# Patient Record
Sex: Male | Born: 1980 | Race: Black or African American | Hispanic: No | Marital: Single | Smoking: Current some day smoker
Health system: Southern US, Community
[De-identification: ages and names within clinical notes are randomized; demographics above are authoritative.]

## PROBLEM LIST (undated history)

## (undated) DIAGNOSIS — J40 Bronchitis, not specified as acute or chronic: Secondary | ICD-10-CM

## (undated) HISTORY — PX: FINGER SURGERY: SHX640

---
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
## 2003-04-15 ENCOUNTER — Emergency Department (HOSPITAL_COMMUNITY): Admission: EM | Admit: 2003-04-15 | Discharge: 2003-04-15 | Payer: Self-pay | Admitting: Emergency Medicine

## 2003-07-11 ENCOUNTER — Emergency Department (HOSPITAL_COMMUNITY): Admission: AD | Admit: 2003-07-11 | Discharge: 2003-07-11 | Payer: Self-pay | Admitting: Emergency Medicine

## 2004-01-13 ENCOUNTER — Emergency Department (HOSPITAL_COMMUNITY): Admission: EM | Admit: 2004-01-13 | Discharge: 2004-01-13 | Payer: Self-pay | Admitting: Emergency Medicine

## 2004-04-09 ENCOUNTER — Emergency Department (HOSPITAL_COMMUNITY): Admission: EM | Admit: 2004-04-09 | Discharge: 2004-04-09 | Payer: Self-pay | Admitting: Emergency Medicine

## 2004-07-02 ENCOUNTER — Emergency Department (HOSPITAL_COMMUNITY): Admission: EM | Admit: 2004-07-02 | Discharge: 2004-07-02 | Payer: Self-pay | Admitting: Emergency Medicine

## 2006-07-29 ENCOUNTER — Emergency Department: Payer: Self-pay | Admitting: Emergency Medicine

## 2006-12-04 ENCOUNTER — Emergency Department: Payer: Self-pay | Admitting: Emergency Medicine

## 2006-12-17 ENCOUNTER — Emergency Department (HOSPITAL_COMMUNITY): Admission: EM | Admit: 2006-12-17 | Discharge: 2006-12-17 | Payer: Self-pay | Admitting: Emergency Medicine

## 2011-06-07 ENCOUNTER — Emergency Department: Payer: Self-pay | Admitting: Emergency Medicine

## 2017-12-21 ENCOUNTER — Emergency Department
Admission: EM | Admit: 2017-12-21 | Discharge: 2017-12-21 | Disposition: A | Discharge status: Home / Self Care | Payer: Self-pay | Attending: Emergency Medicine | Admitting: Emergency Medicine

## 2017-12-21 ENCOUNTER — Other Ambulatory Visit: Payer: Self-pay

## 2017-12-21 ENCOUNTER — Emergency Department: Payer: Self-pay

## 2017-12-21 ENCOUNTER — Encounter: Payer: Self-pay | Admitting: Emergency Medicine

## 2017-12-21 DIAGNOSIS — Y999 Unspecified external cause status: Secondary | ICD-10-CM | POA: Insufficient documentation

## 2017-12-21 DIAGNOSIS — M545 Low back pain: Secondary | ICD-10-CM | POA: Insufficient documentation

## 2017-12-21 DIAGNOSIS — Y92410 Unspecified street and highway as the place of occurrence of the external cause: Secondary | ICD-10-CM | POA: Insufficient documentation

## 2017-12-21 DIAGNOSIS — Y9389 Activity, other specified: Secondary | ICD-10-CM | POA: Insufficient documentation

## 2017-12-21 DIAGNOSIS — M7918 Myalgia, other site: Secondary | ICD-10-CM

## 2017-12-21 DIAGNOSIS — F17200 Nicotine dependence, unspecified, uncomplicated: Secondary | ICD-10-CM | POA: Insufficient documentation

## 2017-12-21 HISTORY — DX: Bronchitis, not specified as acute or chronic: J40

## 2017-12-21 MED ORDER — CYCLOBENZAPRINE HCL 5 MG PO TABS: 5.0000 mg | ORAL_TABLET | Freq: Three times a day (TID) | ORAL | 0 refills | Status: DC | PRN

## 2017-12-21 MED ORDER — IBUPROFEN 800 MG PO TABS
800.0000 mg | ORAL_TABLET | Freq: Once | ORAL | Status: AC
  Administered 2017-12-21: 800 mg via ORAL
  Filled 2017-12-21: qty 1

## 2017-12-21 MED ORDER — CYCLOBENZAPRINE HCL 10 MG PO TABS
10.0000 mg | ORAL_TABLET | Freq: Once | ORAL | Status: AC
  Administered 2017-12-21: 10 mg via ORAL
  Filled 2017-12-21: qty 1
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
## 2019-06-01 ENCOUNTER — Ambulatory Visit: Payer: Self-pay
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
## 2023-03-29 ENCOUNTER — Other Ambulatory Visit: Payer: Self-pay

## 2023-03-29 ENCOUNTER — Emergency Department: Payer: No Typology Code available for payment source

## 2023-03-29 ENCOUNTER — Emergency Department
Admission: EM | Admit: 2023-03-29 | Discharge: 2023-03-29 | Disposition: A | Discharge status: Home / Self Care | Payer: No Typology Code available for payment source | Attending: Emergency Medicine | Admitting: Emergency Medicine

## 2023-03-29 DIAGNOSIS — M25512 Pain in left shoulder: Secondary | ICD-10-CM

## 2023-03-29 DIAGNOSIS — S3992XA Unspecified injury of lower back, initial encounter: Secondary | ICD-10-CM | POA: Diagnosis present

## 2023-03-29 DIAGNOSIS — S39012A Strain of muscle, fascia and tendon of lower back, initial encounter: Secondary | ICD-10-CM

## 2023-03-29 DIAGNOSIS — Y92410 Unspecified street and highway as the place of occurrence of the external cause: Secondary | ICD-10-CM | POA: Insufficient documentation

## 2023-03-29 DIAGNOSIS — S50811A Abrasion of right forearm, initial encounter: Secondary | ICD-10-CM | POA: Diagnosis not present

## 2023-03-29 MED ORDER — CYCLOBENZAPRINE HCL 10 MG PO TABS
10.0000 mg | ORAL_TABLET | Freq: Once | ORAL | Status: AC
  Administered 2023-03-29: 10 mg via ORAL
  Filled 2023-03-29: qty 1

## 2023-03-29 MED ORDER — BACITRACIN ZINC 500 UNIT/GM EX OINT
TOPICAL_OINTMENT | Freq: Two times a day (BID) | CUTANEOUS | Status: DC
  Filled 2023-03-29: qty 0.9

## 2023-03-29 MED ORDER — IBUPROFEN 800 MG PO TABS
800.0000 mg | ORAL_TABLET | Freq: Once | ORAL | Status: AC
  Administered 2023-03-29: 800 mg via ORAL
  Filled 2023-03-29: qty 1

## 2023-03-29 MED ORDER — CYCLOBENZAPRINE HCL 5 MG PO TABS: 5.0000 mg | ORAL_TABLET | Freq: Three times a day (TID) | ORAL | 0 refills | Status: AC | PRN

## 2023-03-29 MED ORDER — MELOXICAM 15 MG PO TABS: 15.0000 mg | ORAL_TABLET | Freq: Every day | ORAL | 2 refills | Status: AC
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
## 2023-03-29 NOTE — Discharge Instructions (Addendum)
Please apply ice to the lower back and left shoulder 20 minutes every hour.  You may take Tylenol every 6 hours.  Take meloxicam daily and Flexeril 3 times daily as needed for muscle spasms.  Try to walk and help loosen the muscles in the neck and lower back.  Avoid heavy lifting pushing or pulling.  Follow-up with orthopedics if no improvement 1 week.  Return to the ER for any worsening symptoms or any urgent changes in your health.

## 2024-02-15 ENCOUNTER — Other Ambulatory Visit: Payer: Self-pay

## 2024-02-15 ENCOUNTER — Emergency Department
Admission: EM | Admit: 2024-02-15 | Discharge: 2024-02-15 | Disposition: A | Discharge status: Home / Self Care | Payer: Self-pay | Attending: Emergency Medicine | Admitting: Emergency Medicine

## 2024-02-15 DIAGNOSIS — M25512 Pain in left shoulder: Secondary | ICD-10-CM | POA: Insufficient documentation

## 2024-02-15 DIAGNOSIS — M25532 Pain in left wrist: Secondary | ICD-10-CM | POA: Insufficient documentation

## 2024-02-15 DIAGNOSIS — R070 Pain in throat: Secondary | ICD-10-CM | POA: Insufficient documentation
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
